# Patient Record
Sex: Male | Born: 1975 | Race: Black or African American | Hispanic: No | Marital: Single | Smoking: Current every day smoker
Health system: Southern US, Community
[De-identification: ages and names within clinical notes are randomized; demographics above are authoritative.]

---

## 2012-10-06 ENCOUNTER — Encounter (HOSPITAL_COMMUNITY): Payer: Self-pay | Admitting: *Deleted

## 2012-10-06 ENCOUNTER — Emergency Department (HOSPITAL_COMMUNITY)
Admission: EM | Admit: 2012-10-06 | Discharge: 2012-10-06 | Disposition: A | Discharge status: Home / Self Care | Payer: 59 | Source: Home / Self Care

## 2012-10-06 DIAGNOSIS — Z202 Contact with and (suspected) exposure to infections with a predominantly sexual mode of transmission: Secondary | ICD-10-CM

## 2012-10-06 MED ORDER — LIDOCAINE HCL (PF) 1 % IJ SOLN
INTRAMUSCULAR | Status: AC
  Filled 2012-10-06: qty 5

## 2012-10-06 MED ORDER — CEFTRIAXONE SODIUM 250 MG IJ SOLR
INTRAMUSCULAR | Status: AC
  Filled 2012-10-06: qty 250

## 2012-10-06 MED ORDER — AZITHROMYCIN 250 MG PO TABS
ORAL_TABLET | ORAL | Status: AC
  Filled 2012-10-06: qty 4

## 2012-10-06 MED ORDER — AZITHROMYCIN 250 MG PO TABS
1000.0000 mg | ORAL_TABLET | Freq: Once | ORAL | Status: AC
  Administered 2012-10-06: 1000 mg via ORAL

## 2012-10-06 MED ORDER — CEFTRIAXONE SODIUM 250 MG IJ SOLR
250.0000 mg | Freq: Once | INTRAMUSCULAR | Status: AC
  Administered 2012-10-06: 250 mg via INTRAMUSCULAR

## 2012-10-06 NOTE — ED Notes (Signed)
RepORTED  OFF  TO [facility]

## 2012-10-06 NOTE — ED Provider Notes (Signed)
History     CSN: [id number]  Arrival date & time 10/06/12  1518   None     Chief Complaint  Patient presents with  . Exposure to STD    (Consider location/radiation/quality/duration/timing/severity/associated sxs/prior treatment) Patient is a 36 y.o. male presenting with STD exposure. The history is provided by the patient. No language interpreter was used.  Exposure to STD This is a new problem. Nothing aggravates the symptoms. Nothing relieves the symptoms.   Pt reports he was exposed to chlamydia and possibly syphilis.  History reviewed. No pertinent past medical history.  History reviewed. No pertinent past surgical history.  No family history on file.  History  Substance Use Topics  . Smoking status: Current Every Day Smoker  . Smokeless tobacco: Not on file  . Alcohol Use: Yes      Review of Systems  All other systems reviewed and are negative.    Allergies  Review of patient's allergies indicates not on file.  Home Medications  No current outpatient prescriptions on file.  There were no vitals taken for this visit.  Physical Exam  Nursing note and vitals reviewed. Constitutional: He is oriented to person, place, and time. He appears well-developed and well-nourished.  HENT:  Head: Normocephalic and atraumatic.  Left Ear: External ear normal.  Cardiovascular: Normal rate.   Neurological: He is alert and oriented to person, place, and time. He has normal reflexes.  Skin: Skin is warm.    ED Course  Procedures (including critical care time)  Labs Reviewed - No data to display No results found.   1. Exposure to STD       MDM  Pt refused swb/genital exam,  Pt request urine gc and ct.  Rpr ordered.  Pt given rocephin and zithromax.   RPR pending       Elson Areas, [place] 10/06/12 1749

## 2012-10-06 NOTE — ED Provider Notes (Signed)
Medical screening examination/treatment/procedure(s) were performed by non-physician practitioner and as supervising physician I was immediately available for consultation/collaboration.  Leslee Home, M.D.   Reuben Likes, MD 10/06/12 2229

## 2012-10-06 NOTE — ED Notes (Signed)
Pt  Reports  He  Was  Told  He  Was  Exposed  To      STD     HE  DENYS  ANY  PENILE  DISCHARGE        Pt  States  He  Was  Informed  Today

## 2014-09-13 ENCOUNTER — Emergency Department (HOSPITAL_COMMUNITY): Payer: 59

## 2014-09-13 ENCOUNTER — Emergency Department (HOSPITAL_COMMUNITY)
Admission: EM | Admit: 2014-09-13 | Discharge: 2014-09-13 | Disposition: A | Discharge status: Home / Self Care | Payer: 59 | Attending: Emergency Medicine | Admitting: Emergency Medicine

## 2014-09-13 ENCOUNTER — Encounter (HOSPITAL_COMMUNITY): Payer: Self-pay | Admitting: Emergency Medicine

## 2014-09-13 DIAGNOSIS — R0789 Other chest pain: Secondary | ICD-10-CM | POA: Diagnosis not present

## 2014-09-13 DIAGNOSIS — Z720 Tobacco use: Secondary | ICD-10-CM | POA: Insufficient documentation

## 2014-09-13 DIAGNOSIS — F1410 Cocaine abuse, uncomplicated: Secondary | ICD-10-CM | POA: Insufficient documentation

## 2014-09-13 DIAGNOSIS — R079 Chest pain, unspecified: Secondary | ICD-10-CM | POA: Diagnosis present

## 2014-09-13 LAB — BASIC METABOLIC PANEL
ANION GAP: 15 (ref 5–15)
BUN: 11 mg/dL (ref 6–23)
CALCIUM: 9.9 mg/dL (ref 8.4–10.5)
CO2: 25 meq/L (ref 19–32)
Chloride: 100 mEq/L (ref 96–112)
Creatinine, Ser: 1.16 mg/dL (ref 0.50–1.35)
GFR calc Af Amer: >90 mL/min (ref >90)
GFR, EST NON AFRICAN AMERICAN: 80 mL/min — AB (ref >90)
GLUCOSE: 107 mg/dL — AB (ref 70–99)
POTASSIUM: 3.9 meq/L (ref 3.7–5.3)
SODIUM: 140 meq/L (ref 137–147)

## 2014-09-13 LAB — CBC
HCT: 40.8 % (ref 39.0–52.0)
HEMOGLOBIN: 13.8 g/dL (ref 13.0–17.0)
MCH: 29.4 pg (ref 26.0–34.0)
MCHC: 33.8 g/dL (ref 30.0–36.0)
MCV: 86.8 fL (ref 78.0–100.0)
PLATELETS: 302 10*3/uL (ref 150–400)
RBC: 4.7 MIL/uL (ref 4.22–5.81)
RDW: 12.3 % (ref 11.5–15.5)
WBC: 8.7 10*3/uL (ref 4.0–10.5)

## 2014-09-13 LAB — I-STAT TROPONIN, ED: TROPONIN I, POC: 0.01 ng/mL (ref 0.00–0.08)

## 2014-09-13 MED ORDER — LORAZEPAM 2 MG/ML IJ SOLN
2.0000 mg | Freq: Once | INTRAMUSCULAR | Status: AC
  Administered 2014-09-13: 2 mg via INTRAVENOUS
  Filled 2014-09-13: qty 1

## 2014-09-13 MED ORDER — SODIUM CHLORIDE 0.9 % IV BOLUS (SEPSIS)
1000.0000 mL | Freq: Once | INTRAVENOUS | Status: AC
  Administered 2014-09-13: 1000 mL via INTRAVENOUS

## 2014-09-13 NOTE — ED Provider Notes (Addendum)
TIME SEEN: 10:44 AM  CHIEF COMPLAINT: Chest pain, shortness of breath  HPI: Patient is a 38 year old male with a history of substance abuse who presents to the emergency department with left-sided sharp chest pain and intermittent tingling in his left chest and back, shortness of breath, lightheadedness that started several hours ago. He reports that he has been using cocaine regularly and last night he used approximately 3 g of cocaine when his symptoms started. He denies having any numbness, tingling currently but will have some numbness and tingling in his left arm and left lower extremity. No weakness. No headache. No history of hypertension, diabetes or hyperlipidemia. He does smoke cigarettes. No family history of premature CAD. No history of PE or DVT, recent prolonged immobilization such as long flight or position, fracture, surgery, trauma. No fevers or cough. No vomiting or diarrhea.  ROS: See HPI Constitutional: no fever  Eyes: no drainage  ENT: no runny nose   Cardiovascular:  no chest pain  Resp: no SOB  GI: no vomiting GU: no dysuria Integumentary: no rash  Allergy: no hives  Musculoskeletal: no leg swelling  Neurological: no slurred speech ROS otherwise negative  PAST MEDICAL HISTORY/PAST SURGICAL HISTORY:  No past medical history on file.  MEDICATIONS:  Prior to Admission medications   Not on File    ALLERGIES:  Not on File  SOCIAL HISTORY:  History  Substance Use Topics  . Smoking status: Current Every Day Smoker    Types: Cigarettes  . Smokeless tobacco: Not on file  . Alcohol Use: Yes    FAMILY HISTORY: No family history on file.  EXAM: BP 172/102  Pulse 112  Temp(Src) 98.1 F (36.7 C) (Oral)  Resp 16  Ht 6' (1.829 m)  Wt 233 lb (105.688 kg)  BMI 31.59 kg/m2  SpO2 99% CONSTITUTIONAL: Alert and oriented and responds appropriately to questions. Well-appearing; well-nourished, appears uncomfortable, anxious but nontoxic HEAD: Normocephalic EYES:  Conjunctivae clear, PERRL ENT: normal nose; no rhinorrhea; moist mucous membranes; pharynx without lesions noted NECK: Supple, no meningismus, no LAD; no thyromegaly CARD: Regular and tachycardic; S1 and S2 appreciated; no murmurs, no clicks, no rubs, no gallops RESP: Normal chest excursion without splinting or tachypnea; breath sounds clear and equal bilaterally; no wheezes, no rhonchi, no rales, no respiratory distress, speaking full sentences, no hypoxia ABD/GI: Normal bowel sounds; non-distended; soft, non-tender, no rebound, no guarding BACK:  The back appears normal and is non-tender to palpation, there is no CVA tenderness EXT: Normal ROM in all joints; non-tender to palpation; no edema; normal capillary refill; no cyanosis; equal pulses in all 4 extremities    SKIN: Normal color for age and race; warm NEURO: Moves all extremities equally; sensation to light touch intact diffusely, cranial nerves II through XII intact, no pronator drift PSYCH: The patient's mood and manner are appropriate. Grooming and personal hygiene are appropriate.  MEDICAL DECISION MAKING: Patient here with complaints of chest pain, shortness of breath and dizziness after using cocaine. EKG shows sinus tachycardia, LVH but no ischemic changes. We'll give IV fluids and IV Ativan. We'll check cardiac labs, chest x-ray. Doubt dissection given he is not having pain currently or neurologic deficits. Suspect pain is secondary to anxiety versus coronary vasospasm from cocaine use. No risk factors for pulmonary embolus.  ED PROGRESS: Patient's vital signs have improved. He reports he is feeling much better after IV Ativan IV fluids. Labs are unremarkable. Troponin negative. Chest x-ray clear with normal mediastinum. We'll discharge home. I do  not feel he needs a second set of cardiac enzymes given his chest pain started over 6 hours ago after using cocaine last night. We'll get outpatient resources to help with his substance abuse.  Discussed return precautions. Patient verbalizes understanding and is comfortable with plan.     EKG Interpretation  Date/Time:  Wednesday September 13 2014 10:08:55 EDT Ventricular Rate:  114 PR Interval:  152 QRS Duration: 93 QT Interval:  349 QTC Calculation: 481 R Axis:   72 Text Interpretation:  Sinus tachycardia Probable left ventricular hypertrophy Borderline prolonged QT interval Baseline wander in lead(s) I III aVL V4 V5 V6 Confirmed by WARD,  DO, KRISTEN ([id number]) on 09/13/2014 10:44:26 AM         Layla MawKristen N Ward, DO 09/13/14 1135  Kristen N Ward, DO 09/13/14 1136

## 2014-09-13 NOTE — ED Notes (Signed)
Pt c/o left side chest pain, dizziness and shob. Pt states that over the course of the night he did about 3 grams of cocaine.

## 2014-09-13 NOTE — Discharge Instructions (Signed)
°Chest Pain (Nonspecific) °It is often hard to give a specific diagnosis for the cause of chest pain. There is always a chance that your pain could be related to something serious, such as a heart attack or a blood clot in the lungs. You need to follow up with your health care provider for further evaluation. °CAUSES  °· Heartburn. °· Pneumonia or bronchitis. °· Anxiety or stress. °· Inflammation around your heart (pericarditis) or lung (pleuritis or pleurisy). °· A blood clot in the lung. °· A collapsed lung (pneumothorax). It can develop suddenly on its own (spontaneous pneumothorax) or from trauma to the chest. °· Shingles infection (herpes zoster virus). °The chest wall is composed of bones, muscles, and cartilage. Any of these can be the source of the pain. °· The bones can be bruised by injury. °· The muscles or cartilage can be strained by coughing or overwork. °· The cartilage can be affected by inflammation and become sore (costochondritis). °DIAGNOSIS  °Lab tests or other studies may be needed to find the cause of your pain. Your health care provider may have you take a test called an ambulatory electrocardiogram (ECG). An ECG records your heartbeat patterns over a 24-hour period. You may also have other tests, such as: °· Transthoracic echocardiogram (TTE). During echocardiography, sound waves are used to evaluate how blood flows through your heart. °· Transesophageal echocardiogram (TEE). °· Cardiac monitoring. This allows your health care provider to monitor your heart rate and rhythm in real time. °· Holter monitor. This is a portable device that records your heartbeat and can help diagnose heart arrhythmias. It allows your health care provider to track your heart activity for several days, if needed. °· Stress tests by exercise or by giving medicine that makes the heart beat faster. °TREATMENT  °· Treatment depends on what may be causing your chest pain. Treatment may include: °¨ Acid blockers for  heartburn. °¨ Anti-inflammatory medicine. °¨ Pain medicine for inflammatory conditions. °¨ Antibiotics if an infection is present. °· You may be advised to change lifestyle habits. This includes stopping smoking and avoiding alcohol, caffeine, and chocolate. °· You may be advised to keep your head raised (elevated) when sleeping. This reduces the chance of acid going backward from your stomach into your esophagus. °Most of the time, nonspecific chest pain will improve within 2-3 days with rest and mild pain medicine.  °HOME CARE INSTRUCTIONS  °· If antibiotics were prescribed, take them as directed. Finish them even if you start to feel better. °· For the next few days, avoid physical activities that bring on chest pain. Continue physical activities as directed. °· Do not use any tobacco products, including cigarettes, chewing tobacco, or electronic cigarettes. °· Avoid drinking alcohol. °· Only take medicine as directed by your health care provider. °· Follow your health care provider's suggestions for further testing if your chest pain does not go away. °· Keep any follow-up appointments you made. If you do not go to an appointment, you could develop lasting (chronic) problems with pain. If there is any problem keeping an appointment, call to reschedule. °SEEK MEDICAL CARE IF:  °· Your chest pain does not go away, even after treatment. °· You have a rash with blisters on your chest. °· You have a fever. °SEEK IMMEDIATE MEDICAL CARE IF:  °· You have increased chest pain or pain that spreads to your arm, neck, jaw, back, or abdomen. °· You have shortness of breath. °· You have an increasing cough, or you cough   up blood. °· You have severe back or abdominal pain. °· You feel nauseous or vomit. °· You have severe weakness. °· You faint. °· You have chills. °This is an emergency. Do not wait to see if the pain will go away. Get medical help at once. Call your local emergency services (911 in [place] Do not drive  yourself to the hospital. °MAKE SURE YOU:  °· Understand these instructions. °· Will watch your condition. °· Will get help right away if you are not doing well or get worse. °Document Released: 08/27/2005 Document Revised: 11/22/2013 Document Reviewed: 06/22/2008 °ExitCare® Patient Information ©2015 [employer]. This information is not intended to replace advice given to you by your health care provider. Make sure you discuss any questions you have with your health care provider. °Stimulant Use Disorder-Cocaine °Cocaine is one of a group of powerful drugs called stimulants. Cocaine has medical uses for stopping nosebleeds and for pain control before minor nose or dental surgery. However, cocaine is misused because of the effects that it produces. These effects include:  °· A feeling of extreme pleasure. °· Alertness. °· High energy. °Common street names for cocaine include coke, crack, blow, snow, and nose candy. Cocaine is snorted, dissolved in water and injected, or smoked.  °Stimulants are addictive because they activate regions of the brain that produce both the pleasurable sensation of "reward" and psychological dependence. Together, these actions account for loss of control and the rapid development of drug dependence. This means you become ill without the drug (withdrawal) and need to keep using it to function.  °Stimulant use disorder is use of stimulants that disrupts your daily life. It disrupts relationships with family and friends and how you do your job. Cocaine increases your blood pressure and heart rate. It can cause a heart attack or stroke. Cocaine can also cause death from irregular heart rate or seizures. °SYMPTOMS °Symptoms of stimulant use disorder with cocaine include: °· Use of cocaine in larger amounts or over a longer period of time than intended. °· Unsuccessful attempts to cut down or control cocaine use. °· A lot of time spent obtaining, using, or recovering from the effects of  cocaine. °· A strong desire or urge to use cocaine (craving). °· Continued use of cocaine in spite of major problems at work, school, or home because of use. °· Continued use of cocaine in spite of relationship problems because of use. °· Giving up or cutting down on important life activities because of cocaine use. °· Use of cocaine over and over in situations when it is physically hazardous, such as driving a car. °· Continued use of cocaine in spite of a physical problem that is likely related to use. Physical problems can include: °¨ Malnutrition. °¨ Nosebleeds. °¨ Chest pain. °¨ High blood pressure. °¨ A hole that develops between the part of your nose that separates your nostrils (perforated nasal septum). °¨ Lung and kidney damage. °· Continued use of cocaine in spite of a mental problem that is likely related to use. Mental problems can include: °¨ Schizophrenia-like symptoms. °¨ Depression. °¨ Bipolar mood swings. °¨ Anxiety. °¨ Sleep problems. °· Need to use more and more cocaine to get the same effect, or lessened effect over time with use of the same amount of cocaine (tolerance). °· Having withdrawal symptoms when cocaine use is stopped, or using cocaine to reduce or avoid withdrawal symptoms. Withdrawal symptoms include: °¨ Depressed or irritable mood. °¨ Low energy or restlessness. °¨ Bad dreams. °¨   Poor or excessive sleep. °¨ Increased appetite. °DIAGNOSIS °Stimulant use disorder is diagnosed by your health care provider. You may be asked questions about your cocaine use and how it affects your life. A physical exam may be done. A drug screen may be ordered. You may be referred to a mental health professional. The diagnosis of stimulant use disorder requires at least two symptoms within 12 months. The type of stimulant use disorder depends on the number of signs and symptoms you have. The type may be: °· Mild. Two or three signs and symptoms. °· Moderate. Four or five signs and symptoms. °· Severe.  Six or more signs and symptoms. °TREATMENT °Treatment for stimulant use disorder is usually provided by mental health professionals with training in substance use disorders. The following options are available: °· Counseling or talk therapy. Talk therapy addresses the reasons you use cocaine and ways to keep you from using again. Goals of talk therapy include: °¨ Identifying and avoiding triggers for use. °¨ Handling cravings. °¨ Replacing use with healthy activities. °· Support groups. Support groups provide emotional support, advice, and guidance. °· Medicine. Certain medicines may decrease cocaine cravings or withdrawal symptoms. °HOME CARE INSTRUCTIONS °· Take medicines only as directed by your health care provider. °· Identify the people and activities that trigger your cocaine use and avoid them. °· Keep all follow-up visits as directed by your health care provider. °SEEK MEDICAL CARE IF: °· Your symptoms get worse or you relapse. °· You are not able to take medicines as directed. °SEEK IMMEDIATE MEDICAL CARE IF: °· You have serious thoughts about hurting yourself or others. °· You have a seizure, chest pain, sudden weakness, or loss of speech or vision. °FOR MORE INFORMATION °· National Institute on Drug Abuse: www.drugabuse.gov °· Substance Abuse and Mental Health Services Administration: www.samhsa.gov °Document Released: 11/14/2000 Document Revised: 04/03/2014 Document Reviewed: 11/30/2013 °ExitCare® Patient Information ©2015 [employer]. This information is not intended to replace advice given to you by your health care provider. Make sure you discuss any questions you have with your health care provider. ° ° ° ° °Emergency Department Resource Guide °1) Find a Doctor and Pay Out of Pocket °Although you won't have to find out who is covered by your insurance plan, it is a good idea to ask around and get recommendations. You will then need to call the office and see if the doctor you have chosen will  accept you as a new patient and what types of options they offer for patients who are self-pay. Some doctors offer discounts or will set up payment plans for their patients who do not have insurance, but you will need to ask so you aren't surprised when you get to your appointment. ° °2) Contact Your Local Health Department °Not all health departments have doctors that can see patients for sick visits, but many do, so it is worth a call to see if yours does. If you don't know where your local health department is, you can check in your phone book. The CDC also has a tool to help you locate your state's health department, and many state websites also have listings of all of their local health departments. ° °3) Find a Walk-in Clinic °If your illness is not likely to be very severe or complicated, you may want to try a walk in clinic. These are popping up all over the country in pharmacies, drugstores, and shopping centers. They're usually staffed by nurse practitioners or physician assistants that have been   trained to treat common illnesses and complaints. They're usually fairly quick and inexpensive. However, if you have serious medical issues or chronic medical problems, these are probably not your best option. ° °No Primary Care Doctor: °- Call [facility] at  832-8000 - they can help you locate a primary care doctor that  accepts your insurance, provides certain services, etc. °- Physician Referral Service- 1-800-533-3463 ° °Chronic Pain Problems: °Organization         Address  Phone   Notes  °[facility]  (336) 297-2271 Patients need to be referred by their primary care doctor.  ° °Medication Assistance: °Organization         Address  Phone   Notes  °[place] [facility] [address] °[place], [place] [address] (336) 641-8030 --Must be a resident of [place] °-- Must have NO insurance coverage whatsoever (no Medicaid/ Medicare, etc.) °-- The pt.  MUST have a primary care doctor that directs their care regularly and follows them in the community °  °[facility]  (866) 331-1348   °[employer]  (888) 892-1162   ° °Agencies that provide inexpensive medical care: °Organization         Address  Phone   Notes  °[facility]  (336) 832-8035   °[facility]    (336) 832-7272   °[facility] [address] °[place], [place] [address] (336) 832-4777   °[facility] [address], °[place] (336) 271-4999   °[facility]    (336) 373-0678   °[facility]    (336) 272-1050   °[facility] ° [address], [place] Phone:  (336) 832-4444, Fax:  (336) 832-4440 Hours of Operation:  9 am - 6 pm, M-F.  Also accepts Medicaid/Medicare and self-pay.  °[facility] ° [address], [place] Phone: (336) [phone], Fax: (336) [phone]. Hours of Operation:  8:30 am - 5:30 pm, M-F.  Also accepts Medicaid and self-pay.  °[facility] [place] [address], [place] Phone: (336) 878-6027   °[facility] [address], [place], [place] (336)723-1848, Ext. 123 Mondays & Thursdays: 7-9 AM.  First 15 patients are seen on a first come, first serve basis. °  ° °Medicaid-accepting [place] Providers: ° °Organization         Address  Phone   Notes  °[facility] [address], [place] (336) 641-2100 Also accepts self-pay patients.  °[facility] [address], [place] ° (336) 856-9996   °[facility] [address], [place] (336) 288-8857   °[facility] [address] [place] [address], [place] (336) 299-7000   °Veita Bland [address], [place]  ° (336) [phone] Only accepts [place] Access Medicaid patients after they have their name applied to their card.  ° °Self-Pay (no insurance) in  [place]: ° °Organization         Address  Phone   Notes  °Sickle Cell Patients, [facility] [address], [place] (336) 832-1970   °[facility] [address],  (336) 832-4400   ° [facility] ° [address], [place] (336) 992-4800   °[facility]/Dr. Osei-Bonsu ° [address] [place] [address],  or [address], [place] (336) [phone] Phone number for both [place] and [place] locations is the same.  °[facility] [address], [place] (336) 299-0000   °[facility] [address] [place] [address], [place] or [address] (336) 852-7530 °(336) 878-2260   °[facility] [address], [place] (336) 350-1642, phone; (336) 294-5005, fax Sees patients 1st and 3rd Saturday of every month.  Must not qualify for public or private insurance (i.e. Medicaid, Medicare, [place] Health Choice, Veterans' Benefits) • Household income should be no more than 200% of the poverty level •The clinic cannot treat you if you are pregnant or think you are pregnant • Sexually transmitted diseases are not treated at the clinic.  ° ° °Dental Care: °Organization         Address  Phone  Notes  °[facility] [address], [place] (336) 641-6152 Accepts children up to age 21 who are enrolled in Medicaid or [place] Health Choice; pregnant women with a Medicaid card; and children who have applied for Medicaid or [place] Health Choice, but were declined, whose parents can pay a reduced fee at time of service.  °[facility] [place]  [address], [place] (336) 641-7733 Accepts children up to age 21 who are enrolled in Medicaid or [place] Health Choice; pregnant women with a Medicaid card; and children who have applied for Medicaid or [place] Health Choice, but were declined, whose  parents can pay a reduced fee at time of service.  °[facility] ° [address], [place] (336) 641-4533 Patients are seen by appointment only. Walk-ins are not accepted. [facility] will see patients 18 years of age and older. °Monday - Tuesday (8am-5pm) °Most Wednesdays (8:30-5pm) °$30 per visit, cash only  °[facility] ° [address], [place] (336) 641-4533 Patients are seen by appointment only. Walk-ins are not accepted. [facility] will see patients 18 years of age and older. °One Wednesday Evening (Monthly: Volunteer Based).  $30 per visit, cash only  °[facility]  (919) [phone] for adults; Children under age 4, call Graduate Pediatric Dentistry at ([phone]) [phone]. Children aged 4-14, please call ([phone]) [phone] to request a pediatric application. ° Dental services are provided in all areas of dental care including fillings, crowns and bridges, complete and partial dentures, implants, gum treatment, root canals, and extractions. Preventive care is also provided. Treatment is provided to both adults and children. °Patients are selected via a lottery and there is often a waiting list. °  °[facility] [name], °[place] ° (336) 763-8833 www.drcivils.[email or web address] °  °[facility] [address], [place], [place] (336)723-1848, Ext. 123 Second and Fourth Thursday of each month, opens at 6:30 AM; Clinic ends at 9 AM.  Patients are seen on a first-come first-served basis, and a limited number are seen during each clinic.  ° °[facility] ° [address], [place], [place] (336) 723-7904   Eligibility Requirements °You must have lived in [place], [place], or [place] counties for at least the last three months. °  You cannot be eligible for state or federal sponsored healthcare insurance, including Veterans Administration, Medicaid, or Medicare. °  You generally cannot be eligible for  healthcare insurance through your employer.  °  How to apply: °Eligibility screenings are   held every Tuesday and Wednesday afternoon from 1:00 pm until 4:00 pm. You do not need an appointment for the interview!  °[facility] [address], [place], [place] 336-631-2330   °[facility]  336-342-8273   °[facility]  336-703-3100   °[facility]  336-570-6415   ° °Behavioral Health Resources in the Community: °Intensive Outpatient Programs °Organization         Address  Phone  Notes  °[facility] [address], [place], [place] 336-878-6098   °[facility] [name], [place], [place] 336-832-9800   °[facility] [address], [place], [place] ° 336-882-2125   °[facility] [address],  °[place], [place] 1-800-853-5163 or 336-641-4981   °Substance Abuse Resources °Organization         Address  Phone  Notes  °[facility]  336-882-2125   °[facility]  336-784-9470   °[facility]  336-285-9073   °[facility]  336-845-3988   °Residential & Outpatient Substance Abuse Program  1-800-659-3381   °Psychological Services °Organization         Address  Phone  Notes  °[facility]  336- 832-9600   °[facility]  336- 378-7881   °[facility] [address], [place] 1-800-853-5163 or 336-641-4981   ° °Mobile Crisis Teams °Organization         Address  Phone  Notes  °[facility], Mobile Crisis Care Unit  1-877-626-1772   °Assertive °[facility] ° [address] [place], [place] 336-834-9664   °Sharon DeEsch [address] °[place] 336-554-5454   ° °Self-Help/Support Groups °Organization         Address  Phone             Notes  °[facility] - variety of support groups  336- 373-1402 Call for more information  °Narcotics  Anonymous (NA), [facility] [address], °[place]  2 meetings at this location  ° °Residential Treatment Programs °Organization         Address  Phone  Notes  °[facility] [address],    °[place]  1-866-801-8205   °[facility] ° [address], [place], [place] 704-293-8524   °[facility] [address], [place] 336-845-3988 Admissions: 8am-3pm M-F  °[facility] [address]    °[place], [place] 336-841-1104   °[facility] [address], [place], [place] 336-379-7146   °[facility] [address]  °[place], [place] 336-285-9073   °[facility] - Intensive Outpatient [address], , [place] 336-852-3033   °[facility] [address]  °[place], [place] 1-877-615-2722 or 336-784-9470   °[facility] [address] [place], [place] 336-227-7417 Accepts Medicaid  °[facility] [address]  ° [place] 1-800-659-3381 Substance Abuse/Addiction Treatment  ° °[place] Behavioral Health Resources °Organization         Address  Phone  Notes  °[facility]  (888) 581-9988   °Julie Brannon, PhD [address] [place], [place]   (336) 349-5553 or (336) 951-0000   °[facility]   [address] °[place], [place] (336) 349-4454   °[facility] [address], [place],  (336) 342-8316 Insurance/Medicaid/sponsorship through   [facility]  °[facility] [address]                                    [place], [place] (336) 342-8316 Therapy/tele-psych/case  °[facility] [address]  ° [place], [place] (336) 349-2233    °Dr. Arfeen  (336) 349-4544   °[facility] [place]  [employer] [place] [facility] 1) [address], [place] °2) [address], [place] °3)  [address], [place] (336) 349-3220 °(336)  342-7768 ° °(336) 342-8140   °[place] [facility] (336) 342-1394 or (336) 342-3537 (After Hours)    ° ° ° °

## 2017-08-13 ENCOUNTER — Ambulatory Visit (INDEPENDENT_AMBULATORY_CARE_PROVIDER_SITE_OTHER): Payer: 59

## 2017-08-13 ENCOUNTER — Ambulatory Visit (INDEPENDENT_AMBULATORY_CARE_PROVIDER_SITE_OTHER): Payer: 59 | Admitting: Surgery

## 2017-08-13 DIAGNOSIS — G8929 Other chronic pain: Secondary | ICD-10-CM

## 2017-08-13 DIAGNOSIS — M5441 Lumbago with sciatica, right side: Secondary | ICD-10-CM

## 2017-08-13 DIAGNOSIS — M5442 Lumbago with sciatica, left side: Secondary | ICD-10-CM

## 2017-08-13 MED ORDER — TRAMADOL HCL 50 MG PO TABS: 50.0000 mg | ORAL_TABLET | Freq: Three times a day (TID) | ORAL | 0 refills | Status: DC | PRN

## 2017-08-13 NOTE — Progress Notes (Signed)
Office Visit Note   Patient: Kristopher MooreDemont [name]           Date of Birth: [birth year]           MRN: [id number] Visit Date: 08/13/2017              Requested by: No referring provider defined for this encounter. PCP: System, Pcp Not In   Assessment & Plan: Visit Diagnoses:  1. Chronic bilateral low back pain with bilateral sciatica     Plan: With patient's ongoing symptoms and failed conservative treatment April, July and September 2018 with injection and oral medication I will schedule him more spine MRI to rule out HNP/stenosis. We'll compare this to previous study done in 2014. Follow-up with Dr. Otelia Sergeantnitka after completion to discuss results and further treatment options. Patient asked me about filling out an intermittent FMLA form and I told him that I do not usually do this and we will discuss work issues when he returns for follow-up. I did tell him that I could take him out of work until we see him back for the scan but he declined.  Follow-Up Instructions: Return in about 3 weeks (around 09/03/2017) for review MRI lumbar with Dr Otelia SergeantNitka.   Orders:  Orders Placed This Encounter  Procedures  . XR Lumbar Spine 2-3 Views  . MR Lumbar Spine w/o contrast   Meds ordered this encounter  Medications  . traMADol (ULTRAM) 50 MG tablet    Sig: Take 1 tablet (50 mg total) by mouth every 8 (eight) hours as needed.    Dispense:  40 tablet    Refill:  0      Procedures: No procedures performed   Clinical Data: No additional findings.   Subjective: No chief complaint on file.   HPI Patient comes in today with complaints of worsening low back pain and left lower extremity radiculopathy. Patient was last seen in the office by Rexene Edisongil Clark PA 2014 for the same complaint. 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 Treated with transforaminal ESI. Over the last 2 years patient's continue to have ongoing symptoms with occasional flareups of  his back pain. This is been getting worse over the last 3-4 months. States that he gets a burning sensation into his left buttock that extends to his left thigh and calf. He occasionally does get some pain in the right buttock but nothing radiating down that leg. No complaints of bowel or bladder continence. Pain aggravated with ambulating, sitting, bending, twisting.  Has not had surgery since he was last seen. [facility] April 2018, July 2018 and most recently last Friday and at all 3 visits he was given IM Depo-Medrol injection, muscle relaxer, and oral prednisone 4-5 days. States that each time he had temporary improvement of his symptoms. Currently his pain is a 6-7 out of 10. Problem is affecting his job. Review of Systems No current complaints of cardiac pulmonary GI GU issues.  Objective: Vital Signs: There were no vitals taken for this visit.  Physical Exam  Constitutional: He is oriented to person, place, and time. He appears well-developed. No distress.  HENT:  Head: Normocephalic and atraumatic.  Eyes: Pupils are equal, round, and reactive to light.  Pulmonary/Chest: No respiratory distress.  Musculoskeletal:  Gait is antalgic. Difficulty with heel and toe gait due to pain in the left low back. Lumbar flexion hands the thighs with discomfort. Pain with  lumbar extension. Positive left lumbar paraspinal tenderness. Positive left sciatic notch tenderness. Negative on the right side. Negative logroll bilateral hips. Positive left straight leg raise. Bilateral calves nontender. No focal motor deficits.  Neurological: He is alert and oriented to person, place, and time.    Ortho Exam  Specialty Comments:  No specialty comments available.  Imaging: Xr Lumbar Spine 2-3 Views  Result Date: 08/13/2017 X-rays lumbar spine shows L5-S1 degenerative disc disease with disc space collapsing. No acute finding.    PMFS History: There are no active problems to  display for this patient.  No past medical history on file.  No family history on file.  No past surgical history on file. Social History   Occupational History  . Not on file.   Social History Main Topics  . Smoking status: Current Every Day Smoker    Types: Cigarettes  . Smokeless tobacco: Not on file  . Alcohol use Yes  . Drug use: Yes    Types: Cocaine  . Sexual activity: Not on file

## 2017-08-25 ENCOUNTER — Telehealth (INDEPENDENT_AMBULATORY_CARE_PROVIDER_SITE_OTHER): Payer: Self-pay

## 2017-08-25 ENCOUNTER — Telehealth (INDEPENDENT_AMBULATORY_CARE_PROVIDER_SITE_OTHER): Payer: Self-pay | Admitting: Orthopaedic Surgery

## 2017-08-25 NOTE — Telephone Encounter (Signed)
Patient would like a Rx refill on muscle relaxer and pain medicine.  Patient has appointment scheduled for MRI on 09/03/17.  Would like a call back today.  Cb# is [phone].  Please advise.  Thank you.

## 2017-08-25 NOTE — Telephone Encounter (Signed)
Please advise 

## 2017-08-25 NOTE — Telephone Encounter (Signed)
Your patient 

## 2017-08-25 NOTE — Telephone Encounter (Signed)
This is not my patient.

## 2017-08-28 ENCOUNTER — Other Ambulatory Visit (INDEPENDENT_AMBULATORY_CARE_PROVIDER_SITE_OTHER): Payer: Self-pay

## 2017-08-28 MED ORDER — TRAMADOL HCL 50 MG PO TABS: 50.0000 mg | ORAL_TABLET | Freq: Three times a day (TID) | ORAL | 0 refills | Status: DC | PRN

## 2017-08-28 NOTE — Telephone Encounter (Signed)
Can refill tramadol per my last script.  Advise patient that if he is hurting that bad he needs to be out of work.  I can't prescribe more medication to make him functional at his job.

## 2017-08-28 NOTE — Telephone Encounter (Signed)
Called into pharmacy, patient aware 

## 2017-09-03 ENCOUNTER — Ambulatory Visit
Admission: RE | Admit: 2017-09-03 | Discharge: 2017-09-03 | Disposition: A | Discharge status: Home / Self Care | Payer: 59 | Source: Ambulatory Visit | Attending: Surgery | Admitting: Surgery

## 2017-09-03 DIAGNOSIS — M5442 Lumbago with sciatica, left side: Principal | ICD-10-CM

## 2017-09-03 DIAGNOSIS — M5441 Lumbago with sciatica, right side: Principal | ICD-10-CM

## 2017-09-03 DIAGNOSIS — G8929 Other chronic pain: Secondary | ICD-10-CM

## 2021-05-21 ENCOUNTER — Other Ambulatory Visit: Payer: Self-pay

## 2021-05-21 ENCOUNTER — Emergency Department (HOSPITAL_COMMUNITY)
Admission: EM | Admit: 2021-05-21 | Discharge: 2021-05-21 | Disposition: A | Discharge status: Home / Self Care | Payer: Self-pay | Attending: Emergency Medicine | Admitting: Emergency Medicine

## 2021-05-21 ENCOUNTER — Encounter (HOSPITAL_COMMUNITY): Payer: Self-pay | Admitting: Emergency Medicine

## 2021-05-21 ENCOUNTER — Emergency Department (HOSPITAL_COMMUNITY): Payer: Self-pay

## 2021-05-21 DIAGNOSIS — F17210 Nicotine dependence, cigarettes, uncomplicated: Secondary | ICD-10-CM | POA: Insufficient documentation

## 2021-05-21 DIAGNOSIS — M5106 Intervertebral disc disorders with myelopathy, lumbar region: Secondary | ICD-10-CM | POA: Insufficient documentation

## 2021-05-21 DIAGNOSIS — M5126 Other intervertebral disc displacement, lumbar region: Secondary | ICD-10-CM

## 2021-05-21 DIAGNOSIS — M5416 Radiculopathy, lumbar region: Secondary | ICD-10-CM | POA: Insufficient documentation

## 2021-05-21 MED ORDER — KETOROLAC TROMETHAMINE 60 MG/2ML IM SOLN
60.0000 mg | Freq: Once | INTRAMUSCULAR | Status: AC
  Administered 2021-05-21: 60 mg via INTRAMUSCULAR
  Filled 2021-05-21: qty 2

## 2021-05-21 MED ORDER — CYCLOBENZAPRINE HCL 10 MG PO TABS: 10.0000 mg | ORAL_TABLET | Freq: Two times a day (BID) | ORAL | 0 refills | Status: DC | PRN

## 2021-05-21 MED ORDER — CYCLOBENZAPRINE HCL 10 MG PO TABS
10.0000 mg | ORAL_TABLET | Freq: Once | ORAL | Status: AC
  Administered 2021-05-21: 10 mg via ORAL
  Filled 2021-05-21: qty 1

## 2021-05-21 MED ORDER — IBUPROFEN 600 MG PO TABS: 600.0000 mg | ORAL_TABLET | Freq: Four times a day (QID) | ORAL | 0 refills | Status: DC | PRN

## 2021-05-21 NOTE — Discharge Instructions (Addendum)
You have acute on chronic low back pain with radicular symptoms into the left leg.  Known lumbar herniated disks.  You would benefit from evaluation by neurosurgery.  Given the painful corn/callus on plantar aspect left foot, you would also benefit from referral to podiatry.  I recommend pain control with muscle relaxants and NSAIDs.  Return to the ER seek immediate medical attention for any new or worsening symptoms.

## 2021-05-21 NOTE — ED Notes (Signed)
Atatempted to do triage, but EDPA Gerre Pebbles wantaed to do the MSE first.
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
## 2021-05-21 NOTE — ED Notes (Signed)
Patient provided with water.

## 2021-05-21 NOTE — ED Triage Notes (Signed)
Pt arriving with [employer] with nerve pain in the left leg. Hx of herniated discs.

## 2022-02-09 IMAGING — CR DG LUMBAR SPINE COMPLETE 4+V
5 series · 5 of 5 positions shown · non-contrast
Comparison: Lumbar radiograph 08/13/2017, MRI 09/03/2017

CLINICAL DATA: Midline back pain.  History of herniation.

EXAM:
LUMBAR SPINE - COMPLETE 4+ VIEW

[t lumbar spine ap]
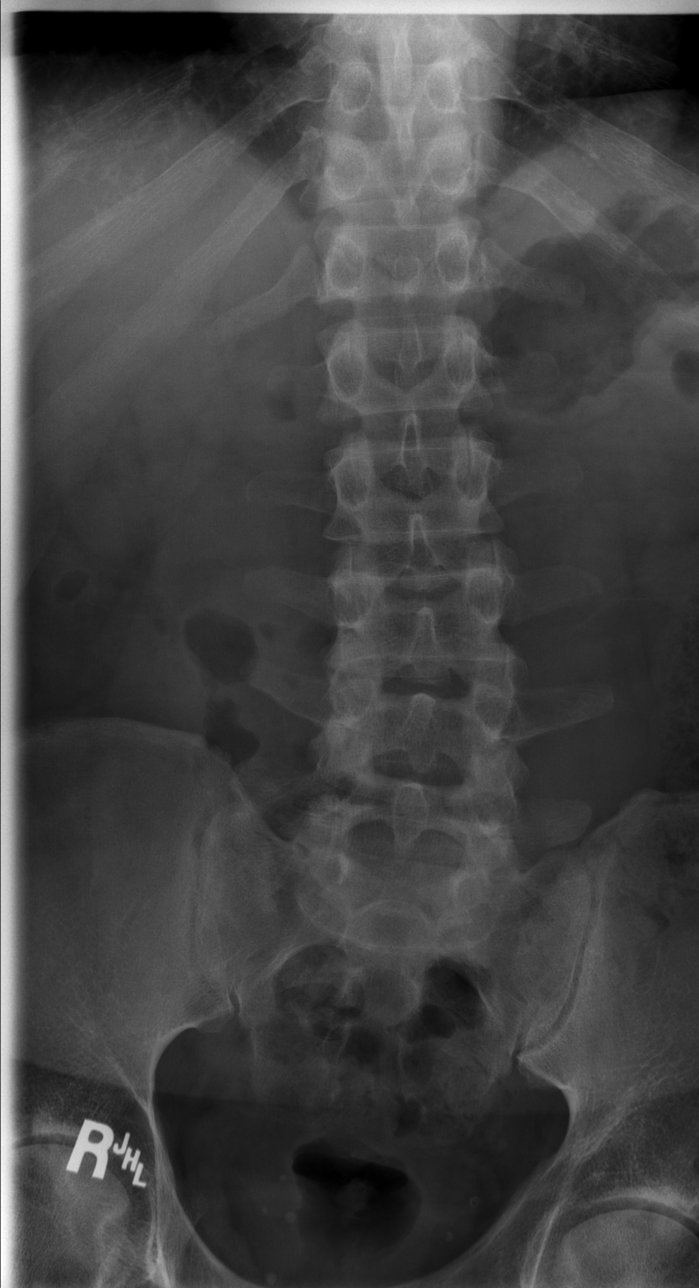

[t lumbar spine obl (1 of 2)]
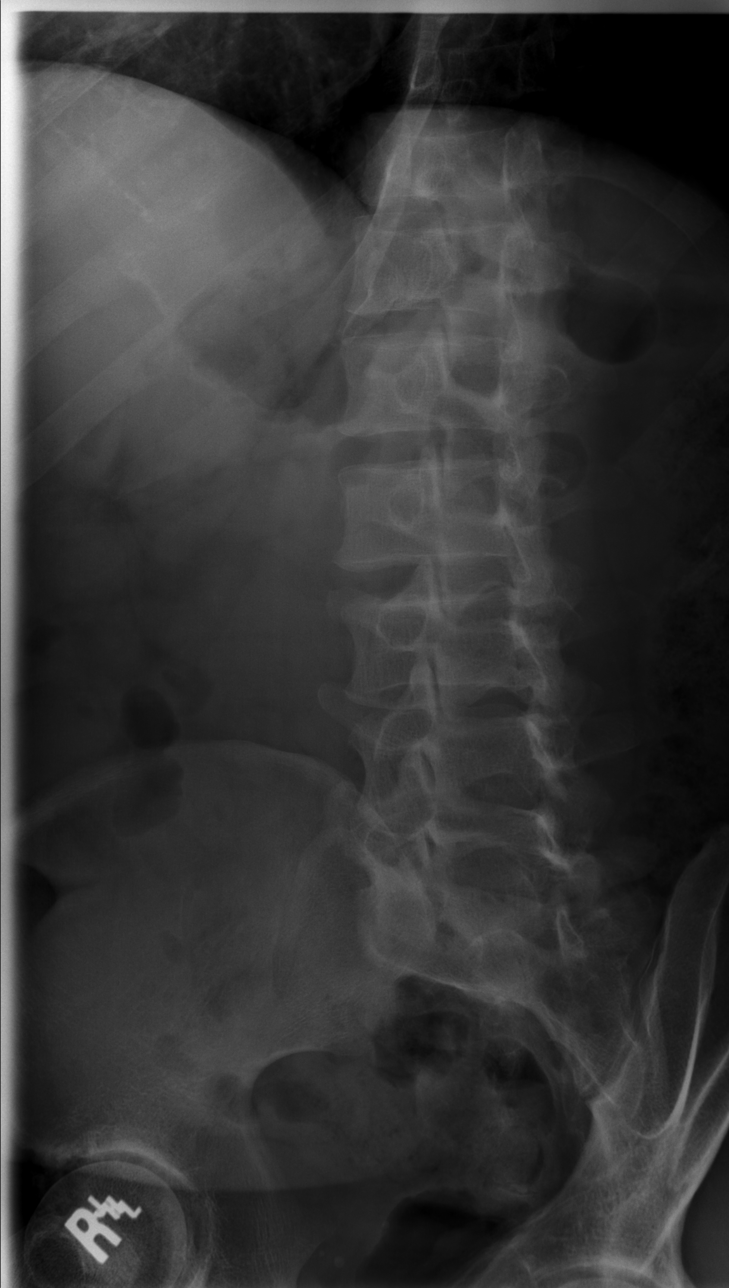

[t lumbar spine obl (2 of 2)]
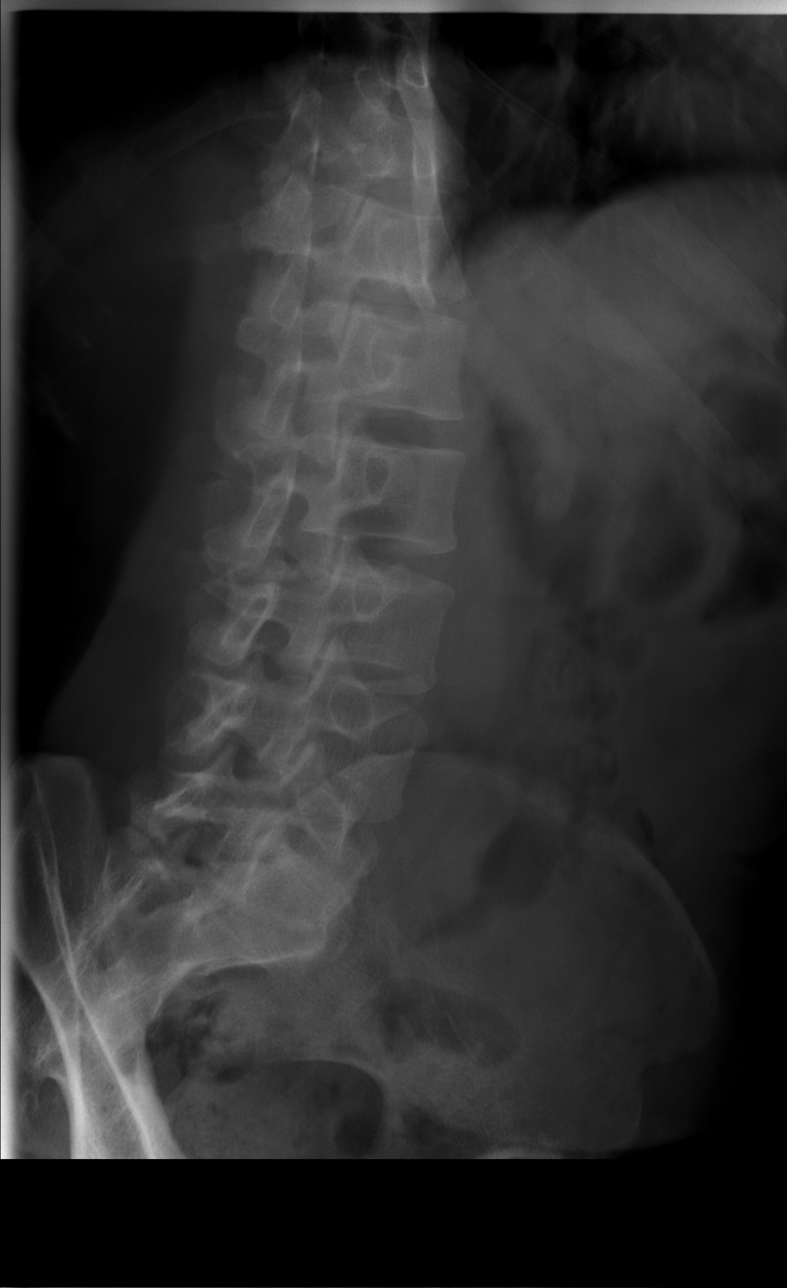

[t lumbar spine lat]
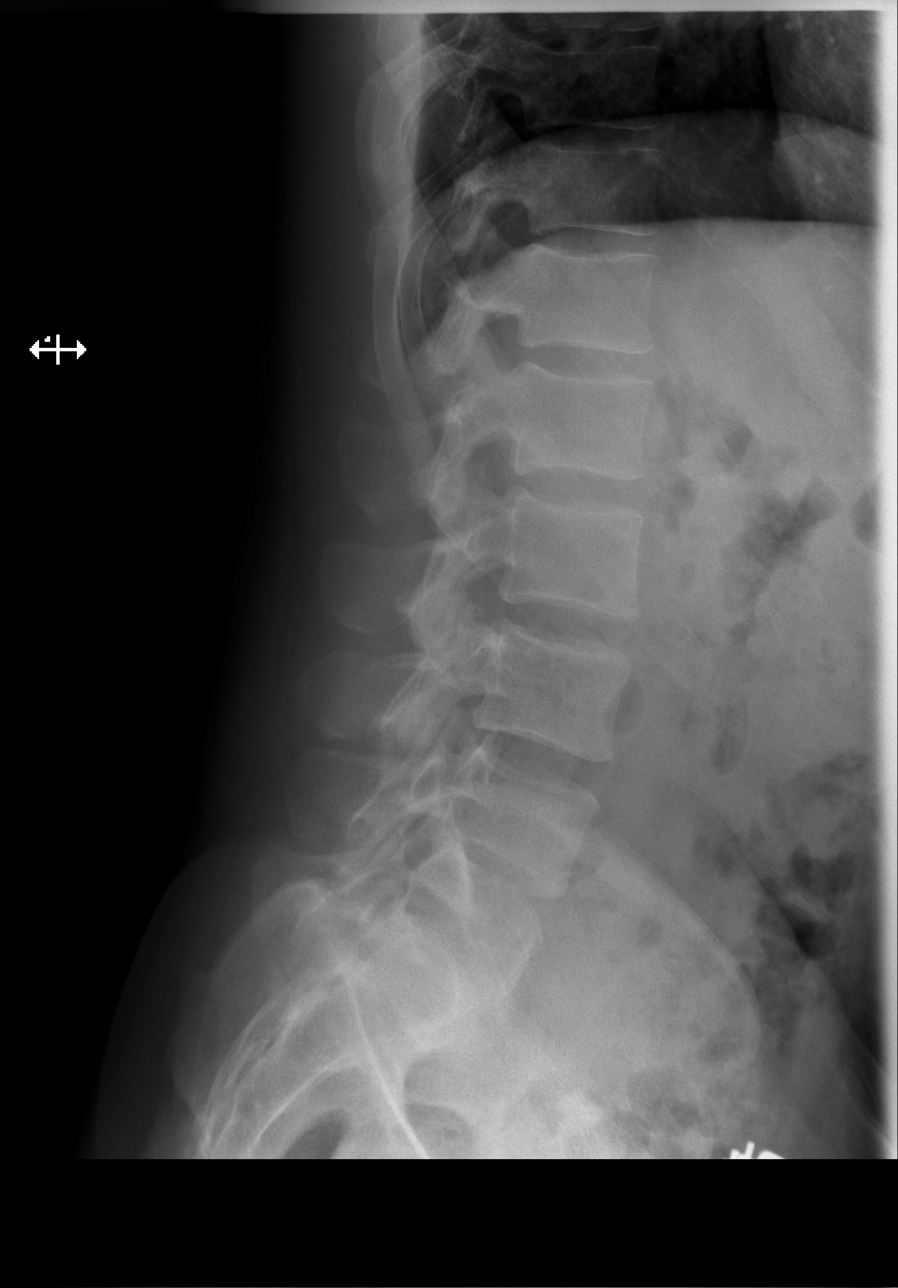

[t lumbar l-5 s-1 spot]
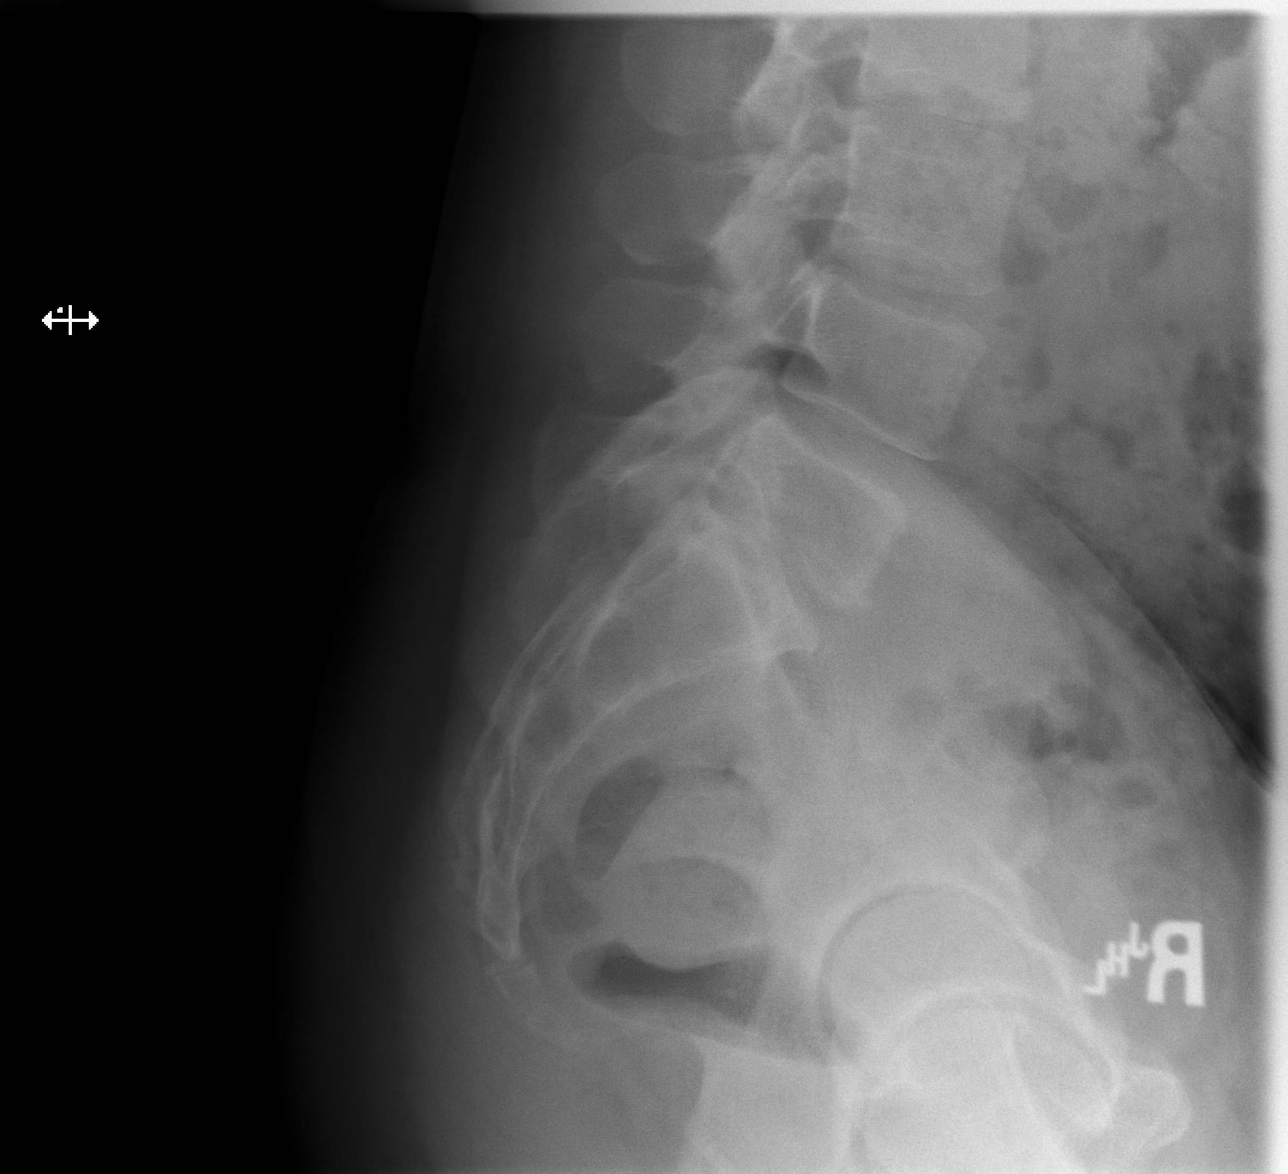

[5 of 5 positions shown; findings below may reference images not displayed]

FINDINGS: Five lumbar type vertebra. Normal alignment. Mild L4-L5 and L5-S1
disc space narrowing. Vertebral body heights are normal. No
fracture, focal lesion or bone destruction. Sacroiliac joints are
congruent.
IMPRESSION: Mild L4-L5 and L5-S1 disc space narrowing. Mild progression from
9357 radiograph.

## 2023-04-25 ENCOUNTER — Emergency Department (HOSPITAL_COMMUNITY)
Admission: EM | Admit: 2023-04-25 | Discharge: 2023-04-26 | Disposition: A | Discharge status: Home / Self Care | Payer: 59 | Attending: Emergency Medicine | Admitting: Emergency Medicine

## 2023-04-25 DIAGNOSIS — R600 Localized edema: Secondary | ICD-10-CM | POA: Insufficient documentation

## 2023-04-26 ENCOUNTER — Other Ambulatory Visit: Payer: Self-pay

## 2023-04-26 ENCOUNTER — Emergency Department (HOSPITAL_COMMUNITY): Payer: 59

## 2023-04-26 ENCOUNTER — Encounter (HOSPITAL_COMMUNITY): Payer: Self-pay

## 2023-04-26 LAB — CBC WITH DIFFERENTIAL/PLATELET
Abs Immature Granulocytes: 0.04 10*3/uL (ref 0.00–0.07)
Basophils Absolute: 0 10*3/uL (ref 0.0–0.1)
Basophils Relative: 1 %
Eosinophils Absolute: 0.2 10*3/uL (ref 0.0–0.5)
Eosinophils Relative: 2 %
HCT: 35.9 % — ABNORMAL LOW (ref 39.0–52.0)
Hemoglobin: 11.4 g/dL — ABNORMAL LOW (ref 13.0–17.0)
Immature Granulocytes: 1 %
Lymphocytes Relative: 29 %
Lymphs Abs: 2.4 10*3/uL (ref 0.7–4.0)
MCH: 29.8 pg (ref 26.0–34.0)
MCHC: 31.8 g/dL (ref 30.0–36.0)
MCV: 94 fL (ref 80.0–100.0)
Monocytes Absolute: 0.7 10*3/uL (ref 0.1–1.0)
Monocytes Relative: 8 %
Neutro Abs: 4.8 10*3/uL (ref 1.7–7.7)
Neutrophils Relative %: 59 %
Platelets: 290 10*3/uL (ref 150–400)
RBC: 3.82 MIL/uL — ABNORMAL LOW (ref 4.22–5.81)
RDW: 13.2 % (ref 11.5–15.5)
WBC: 8.2 10*3/uL (ref 4.0–10.5)
nRBC: 0 % (ref 0.0–0.2)

## 2023-04-26 LAB — COMPREHENSIVE METABOLIC PANEL
ALT: 37 U/L (ref 0–44)
AST: 23 U/L (ref 15–41)
Albumin: 3.7 g/dL (ref 3.5–5.0)
Alkaline Phosphatase: 49 U/L (ref 38–126)
Anion gap: 8 (ref 5–15)
BUN: 19 mg/dL (ref 6–20)
CO2: 24 mmol/L (ref 22–32)
Calcium: 8.4 mg/dL — ABNORMAL LOW (ref 8.9–10.3)
Chloride: 106 mmol/L (ref 98–111)
Creatinine, Ser: 1.1 mg/dL (ref 0.61–1.24)
GFR, Estimated: >60 mL/min (ref >60)
Glucose, Bld: 92 mg/dL (ref 70–99)
Potassium: 3.7 mmol/L (ref 3.5–5.1)
Sodium: 138 mmol/L (ref 135–145)
Total Bilirubin: 0.5 mg/dL (ref 0.3–1.2)
Total Protein: 6.3 g/dL — ABNORMAL LOW (ref 6.5–8.1)

## 2023-04-26 LAB — BRAIN NATRIURETIC PEPTIDE: B Natriuretic Peptide: 35.4 pg/mL (ref 0.0–100.0)

## 2023-04-26 LAB — D-DIMER, QUANTITATIVE: D-Dimer, Quant: 0.34 ug/mL-FEU (ref 0.00–0.50)

## 2023-04-26 MED ORDER — FUROSEMIDE 20 MG PO TABS: 20.0000 mg | ORAL_TABLET | Freq: Every day | ORAL | 0 refills | Status: DC

## 2023-04-26 MED ORDER — FUROSEMIDE 40 MG PO TABS
20.0000 mg | ORAL_TABLET | Freq: Once | ORAL | Status: AC
  Administered 2023-04-26: 20 mg via ORAL
  Filled 2023-04-26: qty 1

## 2023-04-26 NOTE — Discharge Instructions (Signed)
Please follow-up with a family doctor in the office.  There is a hotline number in this paperwork for you to call if you need help finding a family physician.  Otherwise ask family and friends who they see Kristopher [name] and give them a call and see if they can see you in the office.

## 2023-04-26 NOTE — ED Notes (Signed)
Discharge papers reviewed with pt, questions addressed. Pt ambulatory from ED

## 2023-04-26 NOTE — ED Provider Notes (Signed)
[place] EMERGENCY DEPARTMENT AT [facility] Provider Note   CSN: [id number] Arrival date & time: 04/25/23  2352     History  Chief Complaint  Patient presents with   Leg Swelling    Kristopher [name] is a 47 y.o. male.  47 yo M with a chief complaints of bilateral lower extremity edema.  This has been going on for about 3 days.  He said it happened off and on over the years.  Sometimes happens when he is on his feet more often than normal.  No orthopnea no shortness of breath no cough no fever.        Home Medications Prior to Admission medications   Medication Sig Start Date End Date Taking? Authorizing Provider  Aspirin-Salicylamide-Caffeine (BC HEADACHE PO) Take 1 packet by mouth every 6 (six) hours as needed (for pain).   Yes [provider]  furosemide (LASIX) 20 MG tablet Take 1 tablet (20 mg total) by mouth daily. 04/26/23  Yes Melene Plan, DO  ibuprofen (ADVIL) 200 MG tablet Take 200 mg by mouth every 6 (six) hours as needed for moderate pain.   Yes [provider]  tiZANidine (ZANAFLEX) 4 MG capsule Take 4 mg by mouth 3 (three) times daily as needed for muscle spasms. 03/25/23  Yes [provider]  cyclobenzaprine (FLEXERIL) 10 MG tablet Take 1 tablet (10 mg total) by mouth 2 (two) times daily as needed for muscle spasms. Patient not taking: Reported on 04/26/2023 05/21/21   Lorelee New, PA-C  ibuprofen (ADVIL) 600 MG tablet Take 1 tablet (600 mg total) by mouth every 6 (six) hours as needed. Patient not taking: Reported on 04/26/2023 05/21/21   Lorelee New, PA-C  traMADol (ULTRAM) 50 MG tablet Take 1 tablet (50 mg total) by mouth every 8 (eight) hours as needed. Patient not taking: Reported on 04/26/2023 08/28/17   Naida Sleight, PA-C      Allergies    Patient has no known allergies.    Review of Systems   Review of Systems  Physical Exam Updated Vital Signs BP (!) 131/95   Pulse 78   Temp 97.8 F (36.6 C) (Oral)    Resp 19   SpO2 96%  Physical Exam Vitals and nursing note reviewed.  Constitutional:      Appearance: He is well-developed.  HENT:     Head: Normocephalic and atraumatic.  Eyes:     Pupils: Pupils are equal, round, and reactive to light.  Neck:     Vascular: No JVD.  Cardiovascular:     Rate and Rhythm: Normal rate and regular rhythm.     Heart sounds: No murmur heard.    No friction rub. No gallop.  Pulmonary:     Effort: No respiratory distress.     Breath sounds: No wheezing.  Abdominal:     General: There is no distension.     Tenderness: There is no abdominal tenderness. There is no guarding or rebound.  Musculoskeletal:        General: Normal range of motion.     Cervical back: Normal range of motion and neck supple.  Skin:    Coloration: Skin is not pale.     Findings: No rash.  Neurological:     Mental Status: He is alert and oriented to person, place, and time.  Psychiatric:        Behavior: Behavior normal.     ED Results / Procedures / Treatments   Labs (all  labs ordered are listed, but only abnormal results are displayed) Labs Reviewed  CBC WITH DIFFERENTIAL/PLATELET - Abnormal; Notable for the following components:      Result Value   RBC 3.82 (*)    Hemoglobin 11.4 (*)    HCT 35.9 (*)    All other components within normal limits  COMPREHENSIVE METABOLIC PANEL - Abnormal; Notable for the following components:   Calcium 8.4 (*)    Total Protein 6.3 (*)    All other components within normal limits  BRAIN NATRIURETIC PEPTIDE  D-DIMER, QUANTITATIVE    EKG None  Radiology DG Chest Port 1 View  Result Date: 04/26/2023 CLINICAL DATA:  Leg edema EXAM: PORTABLE CHEST 1 VIEW COMPARISON:  09/13/2014 FINDINGS: The heart size and mediastinal contours are within normal limits. Both lungs are clear. The visualized skeletal structures are unremarkable. IMPRESSION: Normal study. Electronically Signed   By: Charlett Nose M.D.   On: 04/26/2023 01:52     Procedures Procedures    Medications Ordered in ED Medications  furosemide (LASIX) tablet 20 mg (has no administration in time range)    ED Course/ Medical Decision Making/ A&P                             Medical Decision Making Amount and/or Complexity of Data Reviewed Labs: ordered. Radiology: ordered.  Risk Prescription drug management.   47 yo M with a chief complaints of bilateral lower extremity edema.  Will screen for heart failure or acute renal dysfunction or acute liver failure.  Chest x-ray.  Chest x-ray independently interpreted by me without focal infiltrate or pneumothorax.  No acute anemia, no significant electrolyte abnormality.  BNP is negative.  LFTs and renal function also reassuring.  2:25 AM:  I have discussed the diagnosis/risks/treatment options with the patient.  Evaluation and diagnostic testing in the emergency department does not suggest an emergent condition requiring admission or immediate intervention beyond what has been performed at this time.  They will follow up with PCP. We also discussed returning to the ED immediately if new or worsening sx occur. We discussed the sx which are most concerning (e.g., sudden worsening pain, fever, inability to tolerate by mouth) that necessitate immediate return. Medications administered to the patient during their visit and any new prescriptions provided to the patient are listed below.  Medications given during this visit Medications  furosemide (LASIX) tablet 20 mg (has no administration in time range)     The patient appears reasonably screen and/or stabilized for discharge and I doubt any other medical condition or other [facility] requiring further screening, evaluation, or treatment in the ED at this time prior to discharge.          Final Clinical Impression(s) / ED Diagnoses Final diagnoses:  Peripheral edema    Rx / DC Orders ED Discharge Orders          Ordered    furosemide (LASIX) 20 MG  tablet  Daily        04/26/23 0222              Melene Plan, DO 04/26/23 0225

## 2023-04-26 NOTE — ED Triage Notes (Signed)
Pt. Arrives pov for bilateral leg and feet swelling x3 days. Pt. States that the swelling has been worse tonight. Pt. Denies pain in the extremities, but endorses tightness.

## 2024-07-27 DIAGNOSIS — Z1159 Encounter for screening for other viral diseases: Secondary | ICD-10-CM | POA: Diagnosis not present

## 2024-07-27 DIAGNOSIS — R0602 Shortness of breath: Secondary | ICD-10-CM | POA: Diagnosis not present

## 2024-07-27 DIAGNOSIS — Z131 Encounter for screening for diabetes mellitus: Secondary | ICD-10-CM | POA: Diagnosis not present

## 2024-07-27 DIAGNOSIS — Z125 Encounter for screening for malignant neoplasm of prostate: Secondary | ICD-10-CM | POA: Diagnosis not present

## 2024-07-27 DIAGNOSIS — Z114 Encounter for screening for human immunodeficiency virus [HIV]: Secondary | ICD-10-CM | POA: Diagnosis not present

## 2024-07-27 DIAGNOSIS — Z0000 Encounter for general adult medical examination without abnormal findings: Secondary | ICD-10-CM | POA: Diagnosis not present

## 2024-08-04 ENCOUNTER — Ambulatory Visit (INDEPENDENT_AMBULATORY_CARE_PROVIDER_SITE_OTHER)

## 2024-08-04 ENCOUNTER — Encounter: Payer: Self-pay | Admitting: Podiatry

## 2024-08-04 ENCOUNTER — Ambulatory Visit (INDEPENDENT_AMBULATORY_CARE_PROVIDER_SITE_OTHER): Admitting: Podiatry

## 2024-08-04 ENCOUNTER — Telehealth: Payer: Self-pay | Admitting: Podiatry

## 2024-08-04 VITALS — Ht 72.0 in | Wt 233.0 lb

## 2024-08-04 DIAGNOSIS — M7752 Other enthesopathy of left foot: Secondary | ICD-10-CM | POA: Diagnosis not present

## 2024-08-04 DIAGNOSIS — M21612 Bunion of left foot: Secondary | ICD-10-CM | POA: Diagnosis not present

## 2024-08-04 NOTE — Telephone Encounter (Signed)
 Received consent forms stating surgery end of sept  Left message for pt to call to get the surgery scheduled.

## 2024-08-04 NOTE — Progress Notes (Signed)
 Subjective:   Patient ID: Kristopher [name], male   DOB: 48 y.o.   MRN: [id number]   HPI Patient presents with tremendous pain underneath the fifth metatarsal head left that he states has been present for years and he has tried to trim it himself and no longer is effective at doing this.  States it is getting worse over that time and that symptoms are gradually becoming more more difficult to deal with and he is not able to work due to the discomfort.  Patient does smoke a small amount of cigarettes per day and likes to be active   Review of Systems  All other systems reviewed and are negative.       Objective:  Physical Exam Vitals and nursing note reviewed.  Constitutional:      Appearance: He is well-developed.  Pulmonary:     Effort: Pulmonary effort is normal.  Musculoskeletal:        General: Normal range of motion.  Skin:    General: Skin is warm.  Neurological:     Mental Status: He is alert.     Neurovascular status intact muscle strength found to be adequate range of motion adequate with exquisite discomfort in the left foot fifth metatarsal head plantar wart large keratotic lesion that is very hard for him to walk.  Does have lesion on the left hallux but not tender.  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  I did do a courtesy debridement of lesion today but I did discuss given its 15-year history and proximity of the fifth metatarsal head that fifth metatarsal head resection is the best chance of getting this better.  I did explain at great length there is no guarantee this will solve the problem and that skin can still recur even in the absence of bone.  He  wants surgery and I allowed him to read consent form line by line and then signed and patient is scheduled for outpatient procedure.  I again went over no guarantee this will solve that but I am hopeful and patient does understand all complications and is encouraged to call me with any questions concerns which may arise  X-rays indicated is in the proximity of the fifth metatarsal head left that all pathology is occurring

## 2024-08-10 DIAGNOSIS — E7800 Pure hypercholesterolemia, unspecified: Secondary | ICD-10-CM | POA: Diagnosis not present

## 2024-08-10 DIAGNOSIS — Z711 Person with feared health complaint in whom no diagnosis is made: Secondary | ICD-10-CM | POA: Diagnosis not present

## 2024-08-10 DIAGNOSIS — N469 Male infertility, unspecified: Secondary | ICD-10-CM | POA: Diagnosis not present

## 2024-08-10 DIAGNOSIS — F17210 Nicotine dependence, cigarettes, uncomplicated: Secondary | ICD-10-CM | POA: Diagnosis not present

## 2024-08-12 ENCOUNTER — Telehealth: Payer: Self-pay | Admitting: Podiatry

## 2024-08-12 NOTE — Telephone Encounter (Signed)
 Pt called nurse triage line asking about confirming surgery date   I returned call and left message surgery is scheduled for 9/23

## 2024-08-15 ENCOUNTER — Telehealth: Payer: Self-pay | Admitting: Podiatry

## 2024-08-15 NOTE — Telephone Encounter (Signed)
 DOS- 08/23/2024  5TH METATARSAL HEAD RES LT- 71886  [facility] EFFECTIVE DATE- 03/31/2024  PER FAX RECEIVED FROM [facility], NO PRIOR AUTH IS REQUIRED FOR CPT CODE 71886. DOCUMENTATION ATTACHED TO SURGICAL CONSENT PACKET.

## 2024-08-16 DIAGNOSIS — H538 Other visual disturbances: Secondary | ICD-10-CM | POA: Diagnosis not present

## 2024-08-16 DIAGNOSIS — Z9189 Other specified personal risk factors, not elsewhere classified: Secondary | ICD-10-CM | POA: Diagnosis not present

## 2024-08-16 DIAGNOSIS — F17210 Nicotine dependence, cigarettes, uncomplicated: Secondary | ICD-10-CM | POA: Diagnosis not present

## 2024-08-16 DIAGNOSIS — R29818 Other symptoms and signs involving the nervous system: Secondary | ICD-10-CM | POA: Diagnosis not present

## 2024-08-23 ENCOUNTER — Telehealth: Payer: Self-pay | Admitting: Podiatry

## 2024-08-23 MED ORDER — HYDROCODONE-ACETAMINOPHEN 10-325 MG PO TABS: 1.0000 | ORAL_TABLET | Freq: Three times a day (TID) | ORAL | 0 refills | Status: AC | PRN

## 2024-08-23 NOTE — Addendum Note (Signed)
 Addended by: MAGDALEN PASCO RAMAN on: 08/23/2024 06:56 AM   Modules accepted: Orders

## 2024-08-23 NOTE — Telephone Encounter (Signed)
 Pt showed up for surgery at the surgery center without a driver per [place].  Pt has been r/s to 9/30 for surgery.

## 2024-08-29 ENCOUNTER — Encounter: Admitting: Podiatry

## 2024-08-30 DIAGNOSIS — M2012 Hallux valgus (acquired), left foot: Secondary | ICD-10-CM | POA: Diagnosis not present

## 2024-09-01 NOTE — Patient Instructions (Addendum)
 Keep dressing on left foot clean/dry/intact. Keep follow up appointment next week.

## 2024-09-05 ENCOUNTER — Encounter

## 2024-09-08 ENCOUNTER — Ambulatory Visit (INDEPENDENT_AMBULATORY_CARE_PROVIDER_SITE_OTHER)

## 2024-09-08 ENCOUNTER — Ambulatory Visit (INDEPENDENT_AMBULATORY_CARE_PROVIDER_SITE_OTHER): Admitting: Podiatry

## 2024-09-08 ENCOUNTER — Encounter

## 2024-09-08 VITALS — BP 127/89 | HR 76 | Temp 98.1°F

## 2024-09-08 DIAGNOSIS — Z9889 Other specified postprocedural states: Secondary | ICD-10-CM

## 2024-09-08 NOTE — Progress Notes (Signed)
 Subjective:   Patient ID: Kristopher [name], male   DOB: 48 y.o.   MRN: [id number]   HPI Patient states doing very well with surgery very pleased   ROS      Objective:  Physical Exam  Neurovascular status intact patient evaluated by nurse who did full evaluation     Assessment:  Doing well post fifth metatarsal head resection left     Plan:  X-ray done sterile dressing reapplied reappoint 2 weeks suture removal and that will be the last time the patient will need to be seen and should be uneventful

## 2024-09-08 NOTE — Patient Instructions (Signed)

## 2024-09-08 NOTE — Progress Notes (Signed)
 Patient presents for post-op visit today, POV # 1 DOS 08/30/24 LT 5TH METATARSAL HEAD RESECTION  Outside of the day I had to get the dressing rewrapped, the pain the day of and after was not too bad. Really sore. I kept the dressing on until today. The pain has subsided. No pressure on the foot with the shoe on. BP: Denies symptoms. Patient advised to f/u with PCP. Provider informed.  Vital Signs: Today's Vitals   09/08/24 0810  BP: (!) 134/92  Pulse: 76  Temp: 98.1 F (36.7 C)  TempSrc: Oral  PainSc: 0-No pain    Radiographs: [x]  Taken []  Not taken  Surgical Site Assessment:  - Dressing:  [x]  Minimal dry blood, intact []  Reinforced   [x]  Changed    - Incision:  [x]  CDI (clean, dry, intact)  []  Mild erythema  []  Drainage noted  - Swelling:  []  None  [x]  Mild  []  Moderate   []  Significant    - Bruising:  []  None  [x]  Present  on 5th toe.   - Sutures/staples:  [x]  Intact  []  Removed Today  [x]  Plan to remove at next visit    -Cast/Splint/Pins: [x]  None []  Intact  []  Removed []  Plan to remove at next visit []  Replaced  -Signs of infection:  [x]  None  []  Present - Describe:   -DME:    []  AFW [x]  Surgical shoe []  Cast  []  Splint  -Walking status:  [x]  Full WB  []  Partial WB  []  NWB  -Utilizing device:  [x]  None []  Knee Scooter []  Crutches []  Wheelchair    DVT assessment:  [x]  Denies symptoms []  Chest pain/SOB []  Pain in calf/redness/warmth   Redressed DSD and ace wrap. Educated on signs of infection, proper dressing care, pain management, and weight bearing status. Patient will contact provider with any new or worsening symptoms. The provider assessed the patient today and reviewed instructions regarding plan of care.

## 2024-09-12 ENCOUNTER — Encounter

## 2024-09-19 ENCOUNTER — Ambulatory Visit: Admitting: Podiatry

## 2024-09-19 ENCOUNTER — Ambulatory Visit (INDEPENDENT_AMBULATORY_CARE_PROVIDER_SITE_OTHER)

## 2024-09-19 ENCOUNTER — Encounter

## 2024-09-19 DIAGNOSIS — M21612 Bunion of left foot: Secondary | ICD-10-CM

## 2024-09-19 NOTE — Progress Notes (Signed)
 Patient presents for post-op visit today, POV # 2 DOS 08/30/24 LT 5TH METATARSAL HEAD RESECTION  Doing pretty good for most part. No complications or anything like that. Doing a decent amount of walking. General soreness. Taking ibuprofen  for that..   Vital Signs: Today's Vitals   09/19/24 0844  PainSc: 3   PainLoc: Foot    Radiographs: [x]  Taken []  Not taken  Surgical Site Assessment:  - Dressing:  [x]  Minimal dry blood, intact []  Reinforced   []  Changed     -RN Notes: n/a  - Incision:  [x]  CDI (clean, dry, intact)  []  Mild erythema  []  Drainage noted   -RN Notes: n/a  - Swelling:  [x]  None  []  Mild  []  Moderate   []  Significant    - Bruising:  [x]  None  []  Present: n/a  - Sutures/staples:  [x]  Intact  [x]  Removed Today  []  Plan to remove at next visit    -Cast/Splint/Pins: [x]  None []  Intact  []  Removed []  Plan to remove at next visit []  Replaced  -Signs of infection:  [x]  None  []  Present - Describe: n/a  -DME:    []  AFW [x]  Surgical shoe []  Cast  []  Splint  -Walking status:  [x]  Full WB  []  Partial WB  []  NWB  -Utilizing device:  [x]  None []  Knee Scooter []  Crutches []  Wheelchair    DVT assessment:  [x]  Denies symptoms []  Chest pain/SOB []  Pain in calf/redness/warmth   Redressed DSD and ace wrap. Educated on signs of infection, proper dressing care, pain management, and weight bearing status. Patient will contact provider with any new or worsening symptoms. The provider assessed the patient today and reviewed instructions regarding plan of care.

## 2024-09-19 NOTE — Progress Notes (Signed)
 Subjective:   Patient ID: Kristopher [name], male   DOB: 48 y.o.   MRN: [id number]   HPI Patient presents stating doing very well with surgery very pleased   ROS      Objective:  Physical Exam  Neurovascular status intact minimal swelling stitches intact     Assessment:  Doing well post fifth metatarsal head resection left     Plan:  Stitches removed sterile dressing applied patient is discharged reappoint as needed as needed.  X-rays show good resection bone good alignment
